# Patient Record
Sex: Female | Born: 2000 | Race: Black or African American | Hispanic: No | Marital: Single | Smoking: Never smoker
Health system: Southern US, Community
[De-identification: ages and names within clinical notes are randomized; demographics above are authoritative.]

## PROBLEM LIST (undated history)

## (undated) DIAGNOSIS — E0590 Thyrotoxicosis, unspecified without thyrotoxic crisis or storm: Secondary | ICD-10-CM

## (undated) DIAGNOSIS — J302 Other seasonal allergic rhinitis: Secondary | ICD-10-CM

## (undated) DIAGNOSIS — E559 Vitamin D deficiency, unspecified: Secondary | ICD-10-CM

## (undated) HISTORY — DX: Other seasonal allergic rhinitis: J30.2

## (undated) HISTORY — DX: Vitamin D deficiency, unspecified: E55.9

## (undated) HISTORY — PX: OTHER SURGICAL HISTORY: SHX169

## (undated) HISTORY — DX: Thyrotoxicosis, unspecified without thyrotoxic crisis or storm: E05.90

---
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
## 2022-07-08 DIAGNOSIS — Z0184 Encounter for antibody response examination: Secondary | ICD-10-CM | POA: Diagnosis not present

## 2022-07-08 DIAGNOSIS — Z111 Encounter for screening for respiratory tuberculosis: Secondary | ICD-10-CM | POA: Diagnosis not present

## 2022-07-15 DIAGNOSIS — Z111 Encounter for screening for respiratory tuberculosis: Secondary | ICD-10-CM | POA: Diagnosis not present

## 2022-07-17 DIAGNOSIS — Z23 Encounter for immunization: Secondary | ICD-10-CM | POA: Diagnosis not present

## 2022-07-18 DIAGNOSIS — Z029 Encounter for administrative examinations, unspecified: Secondary | ICD-10-CM | POA: Diagnosis not present

## 2023-01-01 DIAGNOSIS — Z23 Encounter for immunization: Secondary | ICD-10-CM | POA: Diagnosis not present

## 2023-02-11 DIAGNOSIS — Z23 Encounter for immunization: Secondary | ICD-10-CM | POA: Diagnosis not present

## 2023-07-16 DIAGNOSIS — Z111 Encounter for screening for respiratory tuberculosis: Secondary | ICD-10-CM | POA: Diagnosis not present

## 2023-10-14 DIAGNOSIS — H5213 Myopia, bilateral: Secondary | ICD-10-CM | POA: Diagnosis not present

## 2024-02-12 DIAGNOSIS — Z23 Encounter for immunization: Secondary | ICD-10-CM | POA: Diagnosis not present
